# Patient Record
Sex: Male | Born: 1970 | Race: Black or African American | Hispanic: No | Smoking: Current every day smoker
Health system: Southern US, Community
[De-identification: ages and names within clinical notes are randomized; demographics above are authoritative.]

## PROBLEM LIST (undated history)

## (undated) DIAGNOSIS — J45909 Unspecified asthma, uncomplicated: Secondary | ICD-10-CM

## (undated) DIAGNOSIS — W3400XA Accidental discharge from unspecified firearms or gun, initial encounter: Secondary | ICD-10-CM

## (undated) DIAGNOSIS — G8929 Other chronic pain: Secondary | ICD-10-CM

---

## 2003-02-08 ENCOUNTER — Ambulatory Visit (HOSPITAL_COMMUNITY): Admission: RE | Admit: 2003-02-08 | Discharge: 2003-02-08 | Payer: Self-pay | Admitting: Chiropractic Medicine

## 2003-02-08 ENCOUNTER — Encounter: Payer: Self-pay | Admitting: Chiropractic Medicine

## 2005-03-11 ENCOUNTER — Emergency Department (HOSPITAL_COMMUNITY): Admission: EM | Admit: 2005-03-11 | Discharge: 2005-03-11 | Payer: Self-pay | Admitting: Emergency Medicine

## 2005-04-11 ENCOUNTER — Emergency Department (HOSPITAL_COMMUNITY): Admission: EM | Admit: 2005-04-11 | Discharge: 2005-04-11 | Payer: Self-pay | Admitting: Emergency Medicine

## 2005-04-14 ENCOUNTER — Ambulatory Visit: Payer: Self-pay | Admitting: Nurse Practitioner

## 2005-05-01 ENCOUNTER — Ambulatory Visit: Payer: Self-pay | Admitting: Nurse Practitioner

## 2005-07-07 ENCOUNTER — Ambulatory Visit: Payer: Self-pay | Admitting: Nurse Practitioner

## 2005-07-29 ENCOUNTER — Emergency Department (HOSPITAL_COMMUNITY): Admission: EM | Admit: 2005-07-29 | Discharge: 2005-07-29 | Payer: Self-pay | Admitting: *Deleted

## 2005-08-04 ENCOUNTER — Ambulatory Visit: Payer: Self-pay | Admitting: Nurse Practitioner

## 2005-08-19 ENCOUNTER — Ambulatory Visit: Payer: Self-pay | Admitting: Nurse Practitioner

## 2005-11-06 ENCOUNTER — Emergency Department (HOSPITAL_COMMUNITY): Admission: EM | Admit: 2005-11-06 | Discharge: 2005-11-06 | Payer: Self-pay | Admitting: Emergency Medicine

## 2006-04-07 ENCOUNTER — Emergency Department (HOSPITAL_COMMUNITY): Admission: EM | Admit: 2006-04-07 | Discharge: 2006-04-07 | Payer: Self-pay | Admitting: Emergency Medicine

## 2007-02-01 ENCOUNTER — Emergency Department (HOSPITAL_COMMUNITY): Admission: EM | Admit: 2007-02-01 | Discharge: 2007-02-02 | Payer: Self-pay | Admitting: Emergency Medicine

## 2007-07-01 ENCOUNTER — Emergency Department (HOSPITAL_COMMUNITY): Admission: EM | Admit: 2007-07-01 | Discharge: 2007-07-01 | Payer: Self-pay | Admitting: *Deleted

## 2007-07-04 ENCOUNTER — Emergency Department (HOSPITAL_COMMUNITY): Admission: EM | Admit: 2007-07-04 | Discharge: 2007-07-04 | Payer: Self-pay | Admitting: Emergency Medicine

## 2007-07-10 ENCOUNTER — Emergency Department (HOSPITAL_COMMUNITY): Admission: EM | Admit: 2007-07-10 | Discharge: 2007-07-11 | Payer: Self-pay | Admitting: Emergency Medicine

## 2007-08-20 ENCOUNTER — Emergency Department (HOSPITAL_COMMUNITY): Admission: EM | Admit: 2007-08-20 | Discharge: 2007-08-21 | Payer: Self-pay | Admitting: Emergency Medicine

## 2007-09-22 ENCOUNTER — Ambulatory Visit: Payer: Self-pay | Admitting: Family Medicine

## 2007-10-13 ENCOUNTER — Ambulatory Visit (HOSPITAL_COMMUNITY): Admission: RE | Admit: 2007-10-13 | Discharge: 2007-10-13 | Payer: Self-pay | Admitting: Neurosurgery

## 2007-10-30 ENCOUNTER — Emergency Department (HOSPITAL_COMMUNITY): Admission: EM | Admit: 2007-10-30 | Discharge: 2007-10-30 | Payer: Self-pay | Admitting: Emergency Medicine

## 2007-11-06 ENCOUNTER — Emergency Department (HOSPITAL_COMMUNITY): Admission: EM | Admit: 2007-11-06 | Discharge: 2007-11-06 | Payer: Self-pay | Admitting: *Deleted

## 2007-11-07 ENCOUNTER — Emergency Department: Payer: Self-pay | Admitting: Emergency Medicine

## 2007-11-27 ENCOUNTER — Emergency Department (HOSPITAL_COMMUNITY): Admission: EM | Admit: 2007-11-27 | Discharge: 2007-11-27 | Payer: Self-pay | Admitting: Emergency Medicine

## 2007-11-29 ENCOUNTER — Emergency Department (HOSPITAL_COMMUNITY): Admission: EM | Admit: 2007-11-29 | Discharge: 2007-11-29 | Payer: Self-pay | Admitting: Emergency Medicine

## 2008-06-10 ENCOUNTER — Emergency Department (HOSPITAL_COMMUNITY): Admission: EM | Admit: 2008-06-10 | Discharge: 2008-06-11 | Payer: Self-pay | Admitting: Internal Medicine

## 2011-08-14 LAB — CBC
HCT: 43.3
Hemoglobin: 14.2
MCV: 81.9
Platelets: 203
RBC: 5.29
WBC: 17 — ABNORMAL HIGH

## 2011-08-14 LAB — DIFFERENTIAL
Eosinophils Absolute: 0
Eosinophils Relative: 0
Lymphs Abs: 0.7
Monocytes Absolute: 1
Monocytes Relative: 6

## 2011-08-14 LAB — I-STAT 8, (EC8 V) (CONVERTED LAB)
BUN: 12
Chloride: 106
Glucose, Bld: 118 — ABNORMAL HIGH
Hemoglobin: 16.3
Potassium: 3.6
Sodium: 137
pH, Ven: 7.492 — ABNORMAL HIGH

## 2011-09-04 LAB — DIFFERENTIAL
Basophils Relative: 0
Eosinophils Absolute: 0
Eosinophils Relative: 0
Lymphs Abs: 1.2
Monocytes Absolute: 0.9 — ABNORMAL HIGH
Monocytes Relative: 7
Neutrophils Relative %: 82 — ABNORMAL HIGH

## 2011-09-04 LAB — CBC
Hemoglobin: 12.5 — ABNORMAL LOW
MCHC: 33.5
RBC: 4.53
RDW: 14.4 — ABNORMAL HIGH

## 2011-09-04 LAB — URINALYSIS, ROUTINE W REFLEX MICROSCOPIC
Ketones, ur: NEGATIVE
Nitrite: NEGATIVE
Specific Gravity, Urine: 1.008
Urobilinogen, UA: 0.2
pH: 6

## 2011-09-04 LAB — COMPREHENSIVE METABOLIC PANEL
ALT: 19
AST: 29
Alkaline Phosphatase: 52
Calcium: 9.2
GFR calc Af Amer: >60
Glucose, Bld: 119 — ABNORMAL HIGH
Potassium: 3.8
Sodium: 135
Total Protein: 7

## 2012-07-13 ENCOUNTER — Emergency Department (HOSPITAL_COMMUNITY): Payer: Medicare Other

## 2012-07-13 ENCOUNTER — Encounter (HOSPITAL_COMMUNITY): Payer: Self-pay | Admitting: Emergency Medicine

## 2012-07-13 ENCOUNTER — Emergency Department (HOSPITAL_COMMUNITY)
Admission: EM | Admit: 2012-07-13 | Discharge: 2012-07-14 | Disposition: A | Discharge status: Home / Self Care | Payer: Medicare Other | Attending: Emergency Medicine | Admitting: Emergency Medicine

## 2012-07-13 DIAGNOSIS — R142 Eructation: Secondary | ICD-10-CM | POA: Insufficient documentation

## 2012-07-13 DIAGNOSIS — R109 Unspecified abdominal pain: Secondary | ICD-10-CM

## 2012-07-13 DIAGNOSIS — R141 Gas pain: Secondary | ICD-10-CM | POA: Insufficient documentation

## 2012-07-13 DIAGNOSIS — R143 Flatulence: Secondary | ICD-10-CM | POA: Insufficient documentation

## 2012-07-13 DIAGNOSIS — R112 Nausea with vomiting, unspecified: Secondary | ICD-10-CM | POA: Insufficient documentation

## 2012-07-13 DIAGNOSIS — M545 Low back pain, unspecified: Secondary | ICD-10-CM | POA: Insufficient documentation

## 2012-07-13 DIAGNOSIS — R10819 Abdominal tenderness, unspecified site: Secondary | ICD-10-CM | POA: Insufficient documentation

## 2012-07-13 DIAGNOSIS — Z9889 Other specified postprocedural states: Secondary | ICD-10-CM | POA: Insufficient documentation

## 2012-07-13 DIAGNOSIS — Z87828 Personal history of other (healed) physical injury and trauma: Secondary | ICD-10-CM | POA: Insufficient documentation

## 2012-07-13 DIAGNOSIS — E119 Type 2 diabetes mellitus without complications: Secondary | ICD-10-CM | POA: Insufficient documentation

## 2012-07-13 DIAGNOSIS — Z79899 Other long term (current) drug therapy: Secondary | ICD-10-CM | POA: Insufficient documentation

## 2012-07-13 HISTORY — DX: Other chronic pain: G89.29

## 2012-07-13 HISTORY — DX: Accidental discharge from unspecified firearms or gun, initial encounter: W34.00XA

## 2012-07-13 HISTORY — DX: Unspecified asthma, uncomplicated: J45.909

## 2012-07-13 LAB — CBC WITH DIFFERENTIAL/PLATELET
Basophils Absolute: 0 10*3/uL (ref 0.0–0.1)
Basophils Relative: 0 % (ref 0–1)
Eosinophils Absolute: 0.1 10*3/uL (ref 0.0–0.7)
Eosinophils Relative: 1 % (ref 0–5)
HCT: 44.2 % (ref 39.0–52.0)
Hemoglobin: 14.4 g/dL (ref 13.0–17.0)
Lymphocytes Relative: 30 % (ref 12–46)
Lymphs Abs: 2.2 10*3/uL (ref 0.7–4.0)
MCH: 27 pg (ref 26.0–34.0)
MCHC: 32.6 g/dL (ref 30.0–36.0)
MCV: 82.8 fL (ref 78.0–100.0)
Monocytes Absolute: 0.6 10*3/uL (ref 0.1–1.0)
Monocytes Relative: 7 % (ref 3–12)
Neutro Abs: 4.6 10*3/uL (ref 1.7–7.7)
Neutrophils Relative %: 62 % (ref 43–77)
Platelets: 164 10*3/uL (ref 150–400)
RBC: 5.34 MIL/uL (ref 4.22–5.81)
RDW: 15.5 % (ref 11.5–15.5)
WBC: 7.4 10*3/uL (ref 4.0–10.5)

## 2012-07-13 LAB — LIPASE, BLOOD: Lipase: 20 U/L (ref 11–59)

## 2012-07-13 LAB — BASIC METABOLIC PANEL
BUN: 9 mg/dL (ref 6–23)
CO2: 28 mEq/L (ref 19–32)
Calcium: 9.7 mg/dL (ref 8.4–10.5)
Chloride: 102 mEq/L (ref 96–112)
Creatinine, Ser: 1.44 mg/dL — ABNORMAL HIGH (ref 0.50–1.35)
GFR calc Af Amer: 69 mL/min — ABNORMAL LOW (ref >90)
GFR calc non Af Amer: 60 mL/min — ABNORMAL LOW (ref >90)
Glucose, Bld: 90 mg/dL (ref 70–99)
Potassium: 4.3 mEq/L (ref 3.5–5.1)
Sodium: 140 mEq/L (ref 135–145)

## 2012-07-13 LAB — HEPATIC FUNCTION PANEL
AST: 31 U/L (ref 0–37)
Albumin: 4.1 g/dL (ref 3.5–5.2)
Total Bilirubin: 0.2 mg/dL — ABNORMAL LOW (ref 0.3–1.2)

## 2012-07-13 MED ORDER — IOHEXOL 300 MG/ML  SOLN: 20.0000 mL | INTRAMUSCULAR | Status: AC

## 2012-07-13 MED ORDER — ONDANSETRON HCL 4 MG/2ML IJ SOLN
4.0000 mg | INTRAMUSCULAR | Status: AC
  Administered 2012-07-13: 4 mg via INTRAVENOUS
  Filled 2012-07-13: qty 2

## 2012-07-13 MED ORDER — SODIUM CHLORIDE 0.9 % IV BOLUS (SEPSIS)
1000.0000 mL | INTRAVENOUS | Status: AC
  Administered 2012-07-13: 1000 mL via INTRAVENOUS

## 2012-07-13 MED ORDER — DICYCLOMINE HCL 20 MG PO TABS: 20.0000 mg | ORAL_TABLET | Freq: Two times a day (BID) | ORAL | Status: AC

## 2012-07-13 MED ORDER — ONDANSETRON HCL 4 MG PO TABS: 4.0000 mg | ORAL_TABLET | Freq: Four times a day (QID) | ORAL | Status: AC

## 2012-07-13 MED ORDER — MORPHINE SULFATE 4 MG/ML IJ SOLN
4.0000 mg | Freq: Once | INTRAMUSCULAR | Status: AC
  Administered 2012-07-13: 4 mg via INTRAVENOUS
  Filled 2012-07-13: qty 1

## 2012-07-13 MED ORDER — HYDROMORPHONE HCL PF 1 MG/ML IJ SOLN
1.0000 mg | Freq: Once | INTRAMUSCULAR | Status: AC
  Administered 2012-07-13: 1 mg via INTRAVENOUS
  Filled 2012-07-13: qty 1

## 2012-07-13 MED ORDER — IOHEXOL 300 MG/ML  SOLN
100.0000 mL | Freq: Once | INTRAMUSCULAR | Status: AC | PRN
  Administered 2012-07-13: 100 mL via INTRAVENOUS
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
## 2012-07-13 NOTE — ED Notes (Signed)
Awaiting fluid bolus for discharge.
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
## 2012-07-13 NOTE — ED Notes (Signed)
CT notified that pt has completed oral contrast. 

## 2012-07-13 NOTE — ED Notes (Addendum)
PT reports throwing up every morning for over 1 month.  States he would feel better after vomiting until he ate then immediately would have horrible pain. Pt reports N/V for 2 days that cant keep water down. Has had same in past was diagnosed with gastritis. Pt alert oriented X4 RN notes bowel sounds in all quads normal, abdomin tender to palpation

## 2012-07-14 NOTE — ED Notes (Signed)
Pt d/c home. Verbalized understanding of medication administration. Verbalized need to follow up with PCP. No further questions at this time. Ambulatory. A.O. X 4. Skin warm, dry, intact. Vitals stable, WNL. NAD. Girlfriend at bedside.

## 2012-07-18 NOTE — ED Provider Notes (Signed)
Medical screening examination/treatment/procedure(s) were performed by non-physician practitioner and as supervising physician I was immediately available for consultation/collaboration.  Raeford Razor, MD 07/18/12 [phone]

## 2012-11-26 ENCOUNTER — Emergency Department (HOSPITAL_COMMUNITY)
Admission: EM | Admit: 2012-11-26 | Discharge: 2012-11-27 | Disposition: A | Discharge status: Home / Self Care | Payer: Medicare Other | Attending: Emergency Medicine | Admitting: Emergency Medicine

## 2012-11-26 ENCOUNTER — Emergency Department (HOSPITAL_COMMUNITY): Payer: Medicare Other

## 2012-11-26 ENCOUNTER — Encounter (HOSPITAL_COMMUNITY): Payer: Self-pay

## 2012-11-26 DIAGNOSIS — S8251XA Displaced fracture of medial malleolus of right tibia, initial encounter for closed fracture: Secondary | ICD-10-CM

## 2012-11-26 DIAGNOSIS — S8253XA Displaced fracture of medial malleolus of unspecified tibia, initial encounter for closed fracture: Secondary | ICD-10-CM | POA: Insufficient documentation

## 2012-11-26 DIAGNOSIS — Y929 Unspecified place or not applicable: Secondary | ICD-10-CM | POA: Insufficient documentation

## 2012-11-26 DIAGNOSIS — J45909 Unspecified asthma, uncomplicated: Secondary | ICD-10-CM | POA: Insufficient documentation

## 2012-11-26 DIAGNOSIS — E119 Type 2 diabetes mellitus without complications: Secondary | ICD-10-CM | POA: Insufficient documentation

## 2012-11-26 DIAGNOSIS — Z87828 Personal history of other (healed) physical injury and trauma: Secondary | ICD-10-CM | POA: Insufficient documentation

## 2012-11-26 DIAGNOSIS — G8929 Other chronic pain: Secondary | ICD-10-CM | POA: Insufficient documentation

## 2012-11-26 DIAGNOSIS — F17200 Nicotine dependence, unspecified, uncomplicated: Secondary | ICD-10-CM | POA: Insufficient documentation

## 2012-11-26 DIAGNOSIS — Y939 Activity, unspecified: Secondary | ICD-10-CM | POA: Insufficient documentation

## 2012-11-26 DIAGNOSIS — W208XXA Other cause of strike by thrown, projected or falling object, initial encounter: Secondary | ICD-10-CM | POA: Insufficient documentation

## 2012-11-26 DIAGNOSIS — Z79899 Other long term (current) drug therapy: Secondary | ICD-10-CM | POA: Insufficient documentation

## 2012-11-26 NOTE — ED Notes (Signed)
Pt states a log fell onto his right foot and ankle, has swelling and pain to same, happened approx 4 pm

## 2012-11-27 MED ORDER — OXYCODONE-ACETAMINOPHEN 5-325 MG PO TABS: 2.0000 | ORAL_TABLET | ORAL | Status: DC | PRN

## 2012-11-27 MED ORDER — OXYCODONE-ACETAMINOPHEN 5-325 MG PO TABS
2.0000 | ORAL_TABLET | Freq: Once | ORAL | Status: AC
  Administered 2012-11-27: 2 via ORAL
  Filled 2012-11-27: qty 2
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
## 2019-01-05 ENCOUNTER — Encounter (HOSPITAL_COMMUNITY): Payer: Self-pay | Admitting: Emergency Medicine

## 2019-01-05 ENCOUNTER — Other Ambulatory Visit: Payer: Self-pay

## 2019-01-05 ENCOUNTER — Emergency Department (HOSPITAL_COMMUNITY)
Admission: EM | Admit: 2019-01-05 | Discharge: 2019-01-05 | Disposition: A | Discharge status: Home / Self Care | Payer: Medicare Other | Attending: Emergency Medicine | Admitting: Emergency Medicine

## 2019-01-05 DIAGNOSIS — M5432 Sciatica, left side: Secondary | ICD-10-CM | POA: Diagnosis not present

## 2019-01-05 DIAGNOSIS — Z79899 Other long term (current) drug therapy: Secondary | ICD-10-CM | POA: Diagnosis not present

## 2019-01-05 DIAGNOSIS — F17210 Nicotine dependence, cigarettes, uncomplicated: Secondary | ICD-10-CM | POA: Diagnosis not present

## 2019-01-05 DIAGNOSIS — Z7984 Long term (current) use of oral hypoglycemic drugs: Secondary | ICD-10-CM | POA: Diagnosis not present

## 2019-01-05 DIAGNOSIS — J45909 Unspecified asthma, uncomplicated: Secondary | ICD-10-CM | POA: Diagnosis not present

## 2019-01-05 DIAGNOSIS — M5442 Lumbago with sciatica, left side: Secondary | ICD-10-CM

## 2019-01-05 DIAGNOSIS — G8929 Other chronic pain: Secondary | ICD-10-CM

## 2019-01-05 DIAGNOSIS — E119 Type 2 diabetes mellitus without complications: Secondary | ICD-10-CM | POA: Diagnosis not present

## 2019-01-05 DIAGNOSIS — M545 Low back pain: Secondary | ICD-10-CM | POA: Insufficient documentation

## 2019-01-05 MED ORDER — METHOCARBAMOL 500 MG PO TABS: ORAL_TABLET | ORAL | 0 refills | Status: DC

## 2019-01-05 MED ORDER — DEXAMETHASONE SODIUM PHOSPHATE 10 MG/ML IJ SOLN
10.0000 mg | Freq: Once | INTRAMUSCULAR | Status: AC
  Administered 2019-01-05: 10 mg via INTRAMUSCULAR
  Filled 2019-01-05: qty 1

## 2019-01-05 MED ORDER — PREDNISONE 20 MG PO TABS: ORAL_TABLET | ORAL | 0 refills | Status: DC

## 2019-01-05 MED ORDER — METHOCARBAMOL 500 MG PO TABS
1000.0000 mg | ORAL_TABLET | Freq: Once | ORAL | Status: AC
  Administered 2019-01-05: 1000 mg via ORAL
  Filled 2019-01-05: qty 2

## 2019-01-05 NOTE — ED Notes (Signed)
Upon entering room pt was on cell phone, talking normally without facial grimace, lying on side and calm. When nurse entered for assessment. Pt began showing facial grimacing, squinting eyes, and rolling from side to side in bed. Pt moans of "pain to back". Pt smells of ETOH. Pt reports he has drank "about two forties". Pt says he used to take "perc's" and "roxy's" before he went to prison and insurance just kicked in, so has not seen PCP."

## 2019-01-05 NOTE — ED Notes (Signed)
Upon entering room, pt was sleeping, snoring; called pt name several times loudly before pt awakened - requested pt get up and take medication if desired. Pt woke up and took medication.

## 2019-01-05 NOTE — Discharge Instructions (Addendum)
Use ice or heat for comfort.  Take the medication as prescribed.  You need to get a primary care doctor to manage your problem and refer you to a specialist.
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
## 2019-01-05 NOTE — ED Triage Notes (Signed)
Pt C/O "chronic left side lower back pain." Pt states he got out of prison 60 days ago and his insurance "just kicked in." Pt states he has a "pain pump" but it has not been charged since being released from prison.

## 2021-12-19 ENCOUNTER — Emergency Department (HOSPITAL_COMMUNITY)
Admission: EM | Admit: 2021-12-19 | Discharge: 2021-12-20 | Disposition: A | Discharge status: Home / Self Care | Payer: Medicaid - Out of State | Attending: Emergency Medicine | Admitting: Emergency Medicine

## 2021-12-19 ENCOUNTER — Encounter (HOSPITAL_COMMUNITY): Payer: Self-pay | Admitting: *Deleted

## 2021-12-19 ENCOUNTER — Other Ambulatory Visit: Payer: Self-pay

## 2021-12-19 DIAGNOSIS — Z7984 Long term (current) use of oral hypoglycemic drugs: Secondary | ICD-10-CM | POA: Diagnosis not present

## 2021-12-19 DIAGNOSIS — M25561 Pain in right knee: Secondary | ICD-10-CM | POA: Insufficient documentation

## 2021-12-19 DIAGNOSIS — E119 Type 2 diabetes mellitus without complications: Secondary | ICD-10-CM | POA: Insufficient documentation
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
## 2021-12-20 ENCOUNTER — Emergency Department (HOSPITAL_COMMUNITY): Payer: Medicaid - Out of State

## 2021-12-20 MED ORDER — KETOROLAC TROMETHAMINE 60 MG/2ML IM SOLN: 60.0000 mg | Freq: Once | INTRAMUSCULAR | Status: DC
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
## 2021-12-20 NOTE — ED Notes (Signed)
Pt asked for wheelchair so he could leave and go home. He states he wants to leave AMA. Pt advised of risks of doing so, remains reluctant to stay for treatment. Provider notified, pt left

## 2022-01-31 ENCOUNTER — Emergency Department (HOSPITAL_COMMUNITY)
Admission: EM | Admit: 2022-01-31 | Discharge: 2022-01-31 | Discharge status: Against Medical Advice | Payer: Medicare PPO

## 2022-01-31 NOTE — ED Notes (Signed)
Pt named called once more and pt not seen in waiting room.  ?

## 2022-01-31 NOTE — ED Notes (Signed)
Called pt name in waiting room but no response. Registration seen pt walking out of ED entrance. Will wait to see if pt returns in next few mins.  ?

## 2023-03-01 ENCOUNTER — Other Ambulatory Visit: Payer: Self-pay

## 2023-03-01 ENCOUNTER — Emergency Department (HOSPITAL_COMMUNITY)
Admission: EM | Admit: 2023-03-01 | Discharge: 2023-03-01 | Discharge status: Against Medical Advice | Payer: 59 | Attending: Emergency Medicine | Admitting: Emergency Medicine

## 2023-03-01 ENCOUNTER — Encounter (HOSPITAL_COMMUNITY): Payer: Self-pay | Admitting: Emergency Medicine

## 2023-03-01 DIAGNOSIS — Z5321 Procedure and treatment not carried out due to patient leaving prior to being seen by health care provider: Secondary | ICD-10-CM | POA: Diagnosis not present

## 2023-03-01 DIAGNOSIS — X500XXA Overexertion from strenuous movement or load, initial encounter: Secondary | ICD-10-CM | POA: Diagnosis not present

## 2023-03-01 DIAGNOSIS — M545 Low back pain, unspecified: Secondary | ICD-10-CM | POA: Diagnosis present

## 2023-03-01 NOTE — ED Notes (Signed)
Pt left without seeing provided. Security watched him leave

## 2023-03-01 NOTE — ED Triage Notes (Signed)
Right lower back pain radiates down right leg x 3 days. Pt was lifting furniture today.

## 2023-03-11 ENCOUNTER — Encounter (HOSPITAL_COMMUNITY): Payer: Self-pay

## 2023-03-11 ENCOUNTER — Emergency Department (HOSPITAL_COMMUNITY)
Admission: EM | Admit: 2023-03-11 | Discharge: 2023-03-12 | Disposition: A | Discharge status: Home / Self Care | Payer: 59 | Attending: Emergency Medicine | Admitting: Emergency Medicine

## 2023-03-11 ENCOUNTER — Other Ambulatory Visit: Payer: Self-pay

## 2023-03-11 ENCOUNTER — Emergency Department (HOSPITAL_COMMUNITY): Payer: 59

## 2023-03-11 DIAGNOSIS — S20212A Contusion of left front wall of thorax, initial encounter: Secondary | ICD-10-CM | POA: Diagnosis not present

## 2023-03-11 DIAGNOSIS — S20302A Unspecified superficial injuries of left front wall of thorax, initial encounter: Secondary | ICD-10-CM | POA: Diagnosis present

## 2023-03-11 DIAGNOSIS — Z7984 Long term (current) use of oral hypoglycemic drugs: Secondary | ICD-10-CM | POA: Diagnosis not present

## 2023-03-11 DIAGNOSIS — W010XXA Fall on same level from slipping, tripping and stumbling without subsequent striking against object, initial encounter: Secondary | ICD-10-CM | POA: Diagnosis not present

## 2023-03-11 NOTE — ED Triage Notes (Signed)
Pt ambulatory from home in [place], reports dropping a wood splitter on lower chest/upper abd- abrasion noted, reports this happened about an hour ago, pt also reports separate issue of sciatica on right side lower back radiating down leg x 11 days.

## 2023-03-12 DIAGNOSIS — S20212A Contusion of left front wall of thorax, initial encounter: Secondary | ICD-10-CM | POA: Diagnosis not present

## 2023-03-12 MED ORDER — OXYCODONE-ACETAMINOPHEN 5-325 MG PO TABS
2.0000 | ORAL_TABLET | Freq: Once | ORAL | Status: AC
  Administered 2023-03-12: 2 via ORAL
  Filled 2023-03-12: qty 2

## 2023-03-12 MED ORDER — HYDROCODONE-ACETAMINOPHEN 5-325 MG PO TABS: 1.0000 | ORAL_TABLET | Freq: Four times a day (QID) | ORAL | 0 refills | Status: DC | PRN

## 2023-03-12 NOTE — ED Notes (Signed)
ED Provider at bedside. 

## 2023-03-12 NOTE — ED Provider Notes (Signed)
[place] EMERGENCY DEPARTMENT AT [facility] Provider Note   CSN: [id number] Arrival date & time: 03/11/23  2246     History  Chief Complaint  Patient presents with   Chest Pain    Injury to chest    Austin Harmon is a 52 y.o. male.  Patient is a a 52 year old male presenting with complaints of a left chest wall injury.  He was attempting to move his log splitter when it tipped over and fell on his chest.  He complains of pain to the left lower anterior ribs.  He denies any abdominal pain.  He denies shortness of breath, but pain is exacerbated when he breathes.  No alleviating factors.  The history is provided by the patient.       Home Medications Prior to Admission medications   Medication Sig Start Date End Date Taking? Authorizing Provider  dicyclomine (BENTYL) 20 MG tablet Take 1 tablet (20 mg total) by mouth 2 (two) times daily. 07/13/12 07/13/13  Raeford Razor, MD  glipiZIDE (GLUCOTROL) 5 MG tablet Take 5 mg by mouth daily.    [provider]  methocarbamol (ROBAXIN) 500 MG tablet Take 1 or 2 po Q 6hrs for pain 01/05/19   Devoria Albe, MD  omeprazole (PRILOSEC) 20 MG capsule Take 20 mg by mouth daily.    [provider]  oxyCODONE-acetaminophen (PERCOCET) 10-325 MG per tablet Take 1 tablet by mouth 4 (four) times daily.    [provider]  oxyCODONE-acetaminophen (PERCOCET) 5-325 MG per tablet Take 2 tablets by mouth every 4 (four) hours as needed for pain. 11/27/12   Donnetta Hutching, MD  predniSONE (DELTASONE) 20 MG tablet Take 3 po QD x 3d , then 2 po QD x 3d then 1 po QD x 3d 01/05/19   Devoria Albe, MD      Allergies    Patient has no known allergies.    Review of Systems   Review of Systems  All other systems reviewed and are negative.   Physical Exam Updated Vital Signs BP 117/77   Pulse 83   Temp 98 F (36.7 C) (Oral)   Resp 15   Ht  (1.803 m)   Wt 68 kg   SpO2 95%   BMI 20.91 kg/m  Physical Exam Vitals and  nursing note reviewed.  Constitutional:      General: He is not in acute distress.    Appearance: He is well-developed. He is not diaphoretic.  HENT:     Head: Normocephalic and atraumatic.  Cardiovascular:     Rate and Rhythm: Normal rate and regular rhythm.     Heart sounds: No murmur heard.    No friction rub.  Pulmonary:     Effort: Pulmonary effort is normal. No respiratory distress.     Breath sounds: Normal breath sounds. No wheezing or rales.     Comments: There is an abrasion over the left anterior lower chest.  There is no crepitus.  Breath sounds are equal. Abdominal:     General: Bowel sounds are normal. There is no distension.     Palpations: Abdomen is soft.     Tenderness: There is no abdominal tenderness.  Musculoskeletal:        General: Normal range of motion.     Cervical back: Normal range of motion and neck supple.  Skin:    General: Skin is warm and dry.  Neurological:     Mental Status: He is alert and oriented  to person, place, and time.     Coordination: Coordination normal.     ED Results / Procedures / Treatments   Labs (all labs ordered are listed, but only abnormal results are displayed) Labs Reviewed - No data to display  EKG None  Radiology DG Chest Portable 1 View  Result Date: 03/11/2023 CLINICAL DATA:  Chest pain after dropping wood splitter on lower chest. Abrasion to left lower chest. EXAM: PORTABLE CHEST 1 VIEW COMPARISON:  Radiographs 11/27/2007 FINDINGS: Stable cardiomediastinal silhouette. No focal consolidation, pleural effusion, or pneumothorax. No displaced rib fractures. Chronic left fifth rib fracture. Spinal cord stimulator. Plate and screw fixation left clavicle. IMPRESSION: No active disease. Electronically Signed   By: Minerva Fester M.D.   On: 03/11/2023 23:25    Procedures Procedures    Medications Ordered in ED Medications  oxyCODONE-acetaminophen (PERCOCET/ROXICET) 5-325 MG per tablet 2 tablet (has no administration  in time range)    ED Course/ Medical Decision Making/ A&P  Chest x-ray negative for fracture or pneumothorax.  This appears to be a chest wall contusion to be treated with pain medication, rest, and follow-up as needed.  Final Clinical Impression(s) / ED Diagnoses Final diagnoses:  None    Rx / DC Orders ED Discharge Orders     None         Geoffery Lyons, MD 03/12/23 0025

## 2023-03-12 NOTE — Discharge Instructions (Signed)
Take ibuprofen 600 mg every 6 hours as needed for pain.  Begin taking hydrocodone as prescribed as needed for pain not relieved with ibuprofen.  Follow-up with your primary doctor if symptoms are not improving in the next 1 to 2 weeks.

## 2023-03-30 ENCOUNTER — Other Ambulatory Visit: Payer: Self-pay

## 2023-03-30 ENCOUNTER — Encounter (HOSPITAL_COMMUNITY): Payer: Self-pay | Admitting: Emergency Medicine

## 2023-03-30 ENCOUNTER — Emergency Department (HOSPITAL_COMMUNITY)
Admission: EM | Admit: 2023-03-30 | Discharge: 2023-03-30 | Disposition: A | Discharge status: Home / Self Care | Payer: 59 | Attending: Emergency Medicine | Admitting: Emergency Medicine

## 2023-03-30 ENCOUNTER — Emergency Department (HOSPITAL_COMMUNITY): Payer: 59

## 2023-03-30 DIAGNOSIS — E119 Type 2 diabetes mellitus without complications: Secondary | ICD-10-CM | POA: Diagnosis not present

## 2023-03-30 DIAGNOSIS — R109 Unspecified abdominal pain: Secondary | ICD-10-CM | POA: Insufficient documentation

## 2023-03-30 DIAGNOSIS — S161XXA Strain of muscle, fascia and tendon at neck level, initial encounter: Secondary | ICD-10-CM | POA: Insufficient documentation

## 2023-03-30 DIAGNOSIS — Z7984 Long term (current) use of oral hypoglycemic drugs: Secondary | ICD-10-CM | POA: Insufficient documentation

## 2023-03-30 DIAGNOSIS — R519 Headache, unspecified: Secondary | ICD-10-CM | POA: Insufficient documentation

## 2023-03-30 DIAGNOSIS — Y92410 Unspecified street and highway as the place of occurrence of the external cause: Secondary | ICD-10-CM | POA: Diagnosis not present

## 2023-03-30 DIAGNOSIS — M5417 Radiculopathy, lumbosacral region: Secondary | ICD-10-CM | POA: Diagnosis not present

## 2023-03-30 DIAGNOSIS — J45909 Unspecified asthma, uncomplicated: Secondary | ICD-10-CM | POA: Insufficient documentation

## 2023-03-30 DIAGNOSIS — S199XXA Unspecified injury of neck, initial encounter: Secondary | ICD-10-CM | POA: Diagnosis present

## 2023-03-30 LAB — BASIC METABOLIC PANEL
Anion gap: 9 (ref 5–15)
BUN: 14 mg/dL (ref 6–20)
CO2: 26 mmol/L (ref 22–32)
Calcium: 9.1 mg/dL (ref 8.9–10.3)
Chloride: 104 mmol/L (ref 98–111)
Creatinine, Ser: 1.07 mg/dL (ref 0.61–1.24)
GFR, Estimated: >60 mL/min (ref >60)
Glucose, Bld: 140 mg/dL — ABNORMAL HIGH (ref 70–99)
Potassium: 3.6 mmol/L (ref 3.5–5.1)
Sodium: 139 mmol/L (ref 135–145)

## 2023-03-30 LAB — CBC
HCT: 36.6 % — ABNORMAL LOW (ref 39.0–52.0)
Hemoglobin: 12.1 g/dL — ABNORMAL LOW (ref 13.0–17.0)
MCH: 29.2 pg (ref 26.0–34.0)
MCHC: 33.1 g/dL (ref 30.0–36.0)
MCV: 88.4 fL (ref 80.0–100.0)
Platelets: 230 10*3/uL (ref 150–400)
RBC: 4.14 MIL/uL — ABNORMAL LOW (ref 4.22–5.81)
RDW: 14.5 % (ref 11.5–15.5)
WBC: 5.8 10*3/uL (ref 4.0–10.5)
nRBC: 0 % (ref 0.0–0.2)

## 2023-03-30 MED ORDER — KETOROLAC TROMETHAMINE 15 MG/ML IJ SOLN
15.0000 mg | Freq: Once | INTRAMUSCULAR | Status: AC
  Administered 2023-03-30: 15 mg via INTRAVENOUS
  Filled 2023-03-30: qty 1

## 2023-03-30 MED ORDER — IOHEXOL 300 MG/ML  SOLN
100.0000 mL | Freq: Once | INTRAMUSCULAR | Status: AC | PRN
  Administered 2023-03-30: 100 mL via INTRAVENOUS

## 2023-03-30 MED ORDER — FENTANYL CITRATE (PF) 100 MCG/2ML IJ SOLN
100.0000 ug | Freq: Once | INTRAMUSCULAR | Status: AC
  Administered 2023-03-30: 100 ug via INTRAVENOUS
  Filled 2023-03-30: qty 2
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
## 2023-05-16 ENCOUNTER — Encounter (HOSPITAL_COMMUNITY): Payer: Self-pay | Admitting: Emergency Medicine

## 2023-05-16 ENCOUNTER — Other Ambulatory Visit: Payer: Self-pay

## 2023-05-16 ENCOUNTER — Emergency Department (HOSPITAL_COMMUNITY): Payer: 59

## 2023-05-16 ENCOUNTER — Emergency Department (HOSPITAL_COMMUNITY)
Admission: EM | Admit: 2023-05-16 | Discharge: 2023-05-16 | Disposition: A | Discharge status: Home / Self Care | Payer: 59 | Attending: Emergency Medicine | Admitting: Emergency Medicine

## 2023-05-16 DIAGNOSIS — S52602A Unspecified fracture of lower end of left ulna, initial encounter for closed fracture: Secondary | ICD-10-CM | POA: Insufficient documentation

## 2023-05-16 DIAGNOSIS — M79632 Pain in left forearm: Secondary | ICD-10-CM | POA: Diagnosis not present

## 2023-05-16 DIAGNOSIS — E119 Type 2 diabetes mellitus without complications: Secondary | ICD-10-CM | POA: Diagnosis not present

## 2023-05-16 DIAGNOSIS — S59912A Unspecified injury of left forearm, initial encounter: Secondary | ICD-10-CM | POA: Diagnosis present

## 2023-05-16 DIAGNOSIS — Z7984 Long term (current) use of oral hypoglycemic drugs: Secondary | ICD-10-CM | POA: Diagnosis not present

## 2023-05-16 DIAGNOSIS — S52202A Unspecified fracture of shaft of left ulna, initial encounter for closed fracture: Secondary | ICD-10-CM

## 2023-05-16 DIAGNOSIS — W11XXXA Fall on and from ladder, initial encounter: Secondary | ICD-10-CM | POA: Insufficient documentation

## 2023-05-16 MED ORDER — IBUPROFEN 400 MG PO TABS
600.0000 mg | ORAL_TABLET | Freq: Once | ORAL | Status: AC
  Administered 2023-05-16: 600 mg via ORAL
  Filled 2023-05-16: qty 2

## 2023-05-16 MED ORDER — HYDROCODONE-ACETAMINOPHEN 5-325 MG PO TABS
1.0000 | ORAL_TABLET | Freq: Once | ORAL | Status: AC
  Administered 2023-05-16: 1 via ORAL
  Filled 2023-05-16: qty 1

## 2023-05-16 MED ORDER — IBUPROFEN 600 MG PO TABS: 600.0000 mg | ORAL_TABLET | Freq: Four times a day (QID) | ORAL | 0 refills | Status: AC | PRN

## 2023-05-16 MED ORDER — HYDROCODONE-ACETAMINOPHEN 5-325 MG PO TABS: 1.0000 | ORAL_TABLET | Freq: Four times a day (QID) | ORAL | 0 refills | Status: AC | PRN

## 2023-05-16 NOTE — Discharge Instructions (Signed)
For IVR [place].  Take the pain medications as needed for pain, keep the splint on and keep it dry, follow-up with orthopedics.
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
## 2023-05-16 NOTE — ED Triage Notes (Signed)
Fall from one rung up from ground level from ladder at 3pm. Pain to L forearm with obvious swelling, sensations change to hand (cap refill <3sec, 2+radial). Not blood thinners, no LOC.

## 2023-05-19 ENCOUNTER — Ambulatory Visit (INDEPENDENT_AMBULATORY_CARE_PROVIDER_SITE_OTHER): Payer: 59 | Admitting: Student

## 2023-05-19 DIAGNOSIS — S52202A Unspecified fracture of shaft of left ulna, initial encounter for closed fracture: Secondary | ICD-10-CM | POA: Diagnosis not present

## 2023-05-19 MED ORDER — TRAMADOL HCL 50 MG PO TABS: 50.0000 mg | ORAL_TABLET | Freq: Four times a day (QID) | ORAL | 0 refills | Status: AC | PRN

## 2023-05-20 ENCOUNTER — Ambulatory Visit (HOSPITAL_BASED_OUTPATIENT_CLINIC_OR_DEPARTMENT_OTHER): Payer: 59 | Admitting: Student
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
## 2023-05-22 ENCOUNTER — Ambulatory Visit (HOSPITAL_BASED_OUTPATIENT_CLINIC_OR_DEPARTMENT_OTHER): Payer: 59 | Admitting: Student

## 2023-07-09 ENCOUNTER — Other Ambulatory Visit: Payer: Self-pay | Admitting: Internal Medicine

## 2023-07-10 LAB — COMPLETE METABOLIC PANEL WITH GFR
AG Ratio: 1.5 (calc) (ref 1.0–2.5)
ALT: 21 U/L (ref 9–46)
AST: 38 U/L — ABNORMAL HIGH (ref 10–35)
Albumin: 4.6 g/dL (ref 3.6–5.1)
Alkaline phosphatase (APISO): 90 U/L (ref 35–144)
BUN: 16 mg/dL (ref 7–25)
CO2: 21 mmol/L (ref 20–32)
Calcium: 9.2 mg/dL (ref 8.6–10.3)
Chloride: 103 mmol/L (ref 98–110)
Creat: 1.3 mg/dL (ref 0.70–1.30)
Globulin: 3 g/dL (ref 1.9–3.7)
Glucose, Bld: 113 mg/dL — ABNORMAL HIGH (ref 65–99)
Potassium: 3.9 mmol/L (ref 3.5–5.3)
Sodium: 139 mmol/L (ref 135–146)
Total Bilirubin: 0.7 mg/dL (ref 0.2–1.2)
Total Protein: 7.6 g/dL (ref 6.1–8.1)
eGFR: 67 mL/min/{1.73_m2} (ref >=60)

## 2023-07-10 LAB — CBC
HCT: 42.8 % (ref 38.5–50.0)
Hemoglobin: 13.7 g/dL (ref 13.2–17.1)
MCH: 28.5 pg (ref 27.0–33.0)
MCHC: 32 g/dL (ref 32.0–36.0)
MCV: 89 fL (ref 80.0–100.0)
MPV: 10.4 fL (ref 7.5–12.5)
Platelets: 249 10*3/uL (ref 140–400)
RBC: 4.81 10*6/uL (ref 4.20–5.80)
RDW: 13.9 % (ref 11.0–15.0)
WBC: 7.3 10*3/uL (ref 3.8–10.8)

## 2023-07-10 LAB — LIPID PANEL
Cholesterol: 199 mg/dL (ref <200)
HDL: 85 mg/dL (ref >=40)
LDL Cholesterol (Calc): 69 mg/dL
Non-HDL Cholesterol (Calc): 114 mg/dL (ref <130)
Total CHOL/HDL Ratio: 2.3 (calc) (ref <5.0)
Triglycerides: 376 mg/dL — ABNORMAL HIGH (ref <150)

## 2023-07-10 LAB — PSA: PSA: 0.3 ng/mL (ref <=4.00)

## 2023-07-10 LAB — TSH: TSH: 0.95 m[IU]/L (ref 0.40–4.50)

## 2023-07-10 LAB — VITAMIN D 25 HYDROXY (VIT D DEFICIENCY, FRACTURES): Vit D, 25-Hydroxy: 26 ng/mL — ABNORMAL LOW (ref 30–100)
